# Patient Record
Sex: Male | Born: 1935 | Race: White | Hispanic: No
Health system: Southern US, Community
[De-identification: ages and names within clinical notes are randomized; demographics above are authoritative.]

## PROBLEM LIST (undated history)

## (undated) DIAGNOSIS — I10 Essential (primary) hypertension: Secondary | ICD-10-CM

## (undated) DIAGNOSIS — E559 Vitamin D deficiency, unspecified: Secondary | ICD-10-CM

## (undated) DIAGNOSIS — N400 Enlarged prostate without lower urinary tract symptoms: Secondary | ICD-10-CM

## (undated) DIAGNOSIS — G300 Alzheimer's disease with early onset: Secondary | ICD-10-CM

## (undated) DIAGNOSIS — F339 Major depressive disorder, recurrent, unspecified: Secondary | ICD-10-CM

## (undated) DIAGNOSIS — F0280 Dementia in other diseases classified elsewhere without behavioral disturbance: Secondary | ICD-10-CM

## (undated) DIAGNOSIS — E785 Hyperlipidemia, unspecified: Secondary | ICD-10-CM

## (undated) DIAGNOSIS — E119 Type 2 diabetes mellitus without complications: Secondary | ICD-10-CM

---

## 2018-07-25 ENCOUNTER — Emergency Department
Admission: EM | Admit: 2018-07-25 | Discharge: 2018-07-26 | Disposition: A | Discharge status: Home / Self Care | Payer: Medicare Other | Attending: Emergency Medicine | Admitting: Emergency Medicine

## 2018-07-25 ENCOUNTER — Emergency Department: Payer: Medicare Other

## 2018-07-25 ENCOUNTER — Other Ambulatory Visit: Payer: Self-pay

## 2018-07-25 DIAGNOSIS — W19XXXA Unspecified fall, initial encounter: Secondary | ICD-10-CM | POA: Insufficient documentation

## 2018-07-25 DIAGNOSIS — S51001A Unspecified open wound of right elbow, initial encounter: Secondary | ICD-10-CM | POA: Diagnosis not present

## 2018-07-25 DIAGNOSIS — Z23 Encounter for immunization: Secondary | ICD-10-CM | POA: Insufficient documentation

## 2018-07-25 DIAGNOSIS — I10 Essential (primary) hypertension: Secondary | ICD-10-CM | POA: Diagnosis not present

## 2018-07-25 DIAGNOSIS — Y939 Activity, unspecified: Secondary | ICD-10-CM | POA: Diagnosis not present

## 2018-07-25 DIAGNOSIS — F0280 Dementia in other diseases classified elsewhere without behavioral disturbance: Secondary | ICD-10-CM | POA: Diagnosis not present

## 2018-07-25 DIAGNOSIS — S0083XA Contusion of other part of head, initial encounter: Secondary | ICD-10-CM | POA: Diagnosis not present

## 2018-07-25 DIAGNOSIS — G301 Alzheimer's disease with late onset: Secondary | ICD-10-CM | POA: Insufficient documentation

## 2018-07-25 DIAGNOSIS — S0990XA Unspecified injury of head, initial encounter: Secondary | ICD-10-CM | POA: Diagnosis present

## 2018-07-25 DIAGNOSIS — Y92122 Bedroom in nursing home as the place of occurrence of the external cause: Secondary | ICD-10-CM | POA: Diagnosis not present

## 2018-07-25 DIAGNOSIS — E119 Type 2 diabetes mellitus without complications: Secondary | ICD-10-CM | POA: Insufficient documentation

## 2018-07-25 DIAGNOSIS — S51011A Laceration without foreign body of right elbow, initial encounter: Secondary | ICD-10-CM

## 2018-07-25 DIAGNOSIS — Y999 Unspecified external cause status: Secondary | ICD-10-CM | POA: Insufficient documentation

## 2018-07-25 HISTORY — DX: Vitamin D deficiency, unspecified: E55.9

## 2018-07-25 HISTORY — DX: Type 2 diabetes mellitus without complications: E11.9

## 2018-07-25 HISTORY — DX: Dementia in other diseases classified elsewhere, unspecified severity, without behavioral disturbance, psychotic disturbance, mood disturbance, and anxiety: F02.80

## 2018-07-25 HISTORY — DX: Major depressive disorder, recurrent, unspecified: F33.9

## 2018-07-25 HISTORY — DX: Essential (primary) hypertension: I10

## 2018-07-25 HISTORY — DX: Hyperlipidemia, unspecified: E78.5

## 2018-07-25 HISTORY — DX: Benign prostatic hyperplasia without lower urinary tract symptoms: N40.0

## 2018-07-25 HISTORY — DX: Alzheimer's disease with early onset: G30.0

## 2018-07-25 MED ORDER — TETANUS-DIPHTH-ACELL PERTUSSIS 5-2.5-18.5 LF-MCG/0.5 IM SUSP
0.5000 mL | Freq: Once | INTRAMUSCULAR | Status: AC
  Administered 2018-07-26: 0.5 mL via INTRAMUSCULAR
  Filled 2018-07-25: qty 0.5

## 2018-07-25 NOTE — ED Provider Notes (Signed)
[facility] Emergency Department Provider Note  ____________________________________________   First MD Initiated Contact with Patient 07/25/18 2345     (approximate)  I have reviewed the triage vital signs and the nursing notes.   HISTORY  Chief Complaint Fall  Level 5 exemption history is limited by the patient's profound dementia  HPI Jacob Paul is a 84 y.o. male comes to the emergency department by EMS after an unwitnessed fall at his dementia unit.  He was found lying next to his bed with obvious signs of facial trauma.  The patient himself does not recall what happened and is only able to tell me his name.  He is not sure what year it is nor where he is.  He has no complaints at this time.    Past Medical History:  Diagnosis Date  . Alzheimer's disease with early onset (HCC)   . BPH without obstruction/lower urinary tract symptoms   . Diabetes mellitus type 2, controlled, without complications (HCC)   . Essential hypertension   . Hyperlipidemia, unspecified   . Major depressive disorder, recurrent, unspecified (HCC)   . Vitamin D deficiency     There are no active problems to display for this patient.     Prior to Admission medications   Not on File    Allergies Patient has no known allergies.  No family history on file.  Social History Social History   Tobacco Use  . Smoking status: Not on file  Substance Use Topics  . Alcohol use: Not on file  . Drug use: Not on file    Review of Systems Level 5 exemption history is limited by the patient's dementia ____________________________________________   PHYSICAL EXAM:  VITAL SIGNS: ED Triage Vitals  Enc Vitals Group     BP      Pulse      Resp      Temp      Temp src      SpO2      Weight      Height      Head Circumference      Peak Flow      Pain Score      Pain Loc      Pain Edu?      Excl. in GC?     Constitutional: Alert and oriented x1 to name only.   Appears calm and cooperative Eyes: PERRL EOMI. midrange and brisk Head: Abrasion and ecchymosis to right side of his forehead. Nose: No congestion/rhinnorhea. Mouth/Throat: No trismus Neck: No stridor.  No midline tenderness or step-offs Cardiovascular: Normal rate, regular rhythm. Grossly normal heart sounds.  Good peripheral circulation. Respiratory: Normal respiratory effort.  No retractions. Lungs CTAB and moving good air Gastrointestinal: Soft nontender Musculoskeletal: No lower extremity edema   Neurologic:   No gross focal neurologic deficits are appreciated. Skin: 3 cm skin tear to right lateral elbow Psychiatric: Calm and cooperative but with profound dementia    ____________________________________________   DIFFERENTIAL includes but not limited to  Syncope, mechanical fall, metabolic arrangement, intracerebral hemorrhage, cervical spine fracture ____________________________________________   LABS (all labs ordered are listed, but only abnormal results are displayed)  Labs Reviewed  URINALYSIS, COMPLETE (UACMP) WITH MICROSCOPIC - Abnormal; Notable for the following components:      Result Value   Color, Urine YELLOW (*)    APPearance CLEAR (*)    Leukocytes, UA TRACE (*)    Bacteria, UA RARE (*)    All other components  within normal limits  COMPREHENSIVE METABOLIC PANEL - Abnormal; Notable for the following components:   Glucose, Bld 109 (*)    All other components within normal limits  CBC WITH DIFFERENTIAL/PLATELET - Abnormal; Notable for the following components:   RBC 3.95 (*)    Hemoglobin 12.8 (*)    HCT 37.9 (*)    All other components within normal limits  TROPONIN I    Lab work reviewed by me with no clear etiology of the patient's symptoms identified __________________________________________  EKG  ED ECG REPORT I, Merrily Brittle, the attending physician, personally viewed and interpreted this ECG.  Date: 07/25/2018 EKG Time:  Rate:  71 Rhythm: normal sinus rhythm QRS Axis: Rightward axis Intervals: First-degree AV block ST/T Wave abnormalities: Bifascicular block with appropriate repolarization Narrative Interpretation: no evidence of acute ischemia  ____________________________________________  RADIOLOGY  CT scan of the head and neck reviewed by me with no acute disease Chest x-ray and elbow x-ray reviewed by me with no acute disease ____________________________________________   PROCEDURES  Procedure(s) performed: no  Procedures  Critical Care performed: no  ____________________________________________   INITIAL IMPRESSION / ASSESSMENT AND PLAN / ED COURSE  Pertinent labs & imaging results that were available during my care of the patient were reviewed by me and considered in my medical decision making (see chart for details).   As part of my medical decision making, I reviewed the following data within the electronic MEDICAL RECORD NUMBER History obtained from family if available, nursing notes, old chart and ekg, as well as notes from prior ED visits.  Patient comes to the emergency department with obvious facial trauma after either a mechanical fall versus syncope.  CT head and neck were obtained given the trauma and fortunately are negative.  Lab work is reassuring and EKG with no concerning signs for ventricular tachycardia.  He was kept on monitor multiple hours with no ectopy.  At this point he is medically stable for outpatient management.  Tetanus is been updated.      ____________________________________________   FINAL CLINICAL IMPRESSION(S) / ED DIAGNOSES  Final diagnoses:  Fall, initial encounter  Contusion of face, initial encounter  Skin tear of right elbow without complication, initial encounter      NEW MEDICATIONS STARTED DURING THIS VISIT:  There are no discharge medications for this patient.    Note:  This document was prepared using Dragon voice recognition software and  may include unintentional dictation errors.     Merrily Brittle, MD 07/27/18 [phone]

## 2018-07-25 NOTE — ED Triage Notes (Signed)
Patient presents to Emergency Department via EMS with complaints of unwitnessed fall.  Pt from [facility], and was found by staff lying next to bed.  Pt is from dementia unit and hx of Alzheimer's, no evidence of blood thinners.  Pt oriented to self and birthday only.

## 2018-07-26 ENCOUNTER — Encounter: Payer: Self-pay | Admitting: Emergency Medicine

## 2018-07-26 DIAGNOSIS — S0083XA Contusion of other part of head, initial encounter: Secondary | ICD-10-CM | POA: Diagnosis not present

## 2018-07-26 LAB — COMPREHENSIVE METABOLIC PANEL
ALT: 19 U/L (ref 0–44)
ANION GAP: 8 (ref 5–15)
AST: 29 U/L (ref 15–41)
Albumin: 4.6 g/dL (ref 3.5–5.0)
Alkaline Phosphatase: 60 U/L (ref 38–126)
BUN: 23 mg/dL (ref 8–23)
CHLORIDE: 108 mmol/L (ref 98–111)
CO2: 23 mmol/L (ref 22–32)
Calcium: 9.3 mg/dL (ref 8.9–10.3)
Creatinine, Ser: 0.97 mg/dL (ref 0.61–1.24)
Glucose, Bld: 109 mg/dL — ABNORMAL HIGH (ref 70–99)
POTASSIUM: 3.6 mmol/L (ref 3.5–5.1)
SODIUM: 139 mmol/L (ref 135–145)
Total Bilirubin: 0.6 mg/dL (ref 0.3–1.2)
Total Protein: 6.9 g/dL (ref 6.5–8.1)

## 2018-07-26 LAB — URINALYSIS, COMPLETE (UACMP) WITH MICROSCOPIC
Bilirubin Urine: NEGATIVE
Glucose, UA: NEGATIVE mg/dL
Hgb urine dipstick: NEGATIVE
Ketones, ur: NEGATIVE mg/dL
Nitrite: NEGATIVE
PROTEIN: NEGATIVE mg/dL
SPECIFIC GRAVITY, URINE: 1.014 (ref 1.005–1.030)
pH: 6 (ref 5.0–8.0)

## 2018-07-26 LAB — CBC WITH DIFFERENTIAL/PLATELET
Abs Immature Granulocytes: 0.01 10*3/uL (ref 0.00–0.07)
BASOS PCT: 0 %
Basophils Absolute: 0 10*3/uL (ref 0.0–0.1)
Eosinophils Absolute: 0.1 10*3/uL (ref 0.0–0.5)
Eosinophils Relative: 1 %
HCT: 37.9 % — ABNORMAL LOW (ref 39.0–52.0)
Hemoglobin: 12.8 g/dL — ABNORMAL LOW (ref 13.0–17.0)
IMMATURE GRANULOCYTES: 0 %
Lymphocytes Relative: 22 %
Lymphs Abs: 1.2 10*3/uL (ref 0.7–4.0)
MCH: 32.4 pg (ref 26.0–34.0)
MCHC: 33.8 g/dL (ref 30.0–36.0)
MCV: 95.9 fL (ref 80.0–100.0)
Monocytes Absolute: 0.5 10*3/uL (ref 0.1–1.0)
Monocytes Relative: 9 %
NEUTROS PCT: 68 %
Neutro Abs: 3.8 10*3/uL (ref 1.7–7.7)
PLATELETS: 223 10*3/uL (ref 150–400)
RBC: 3.95 MIL/uL — AB (ref 4.22–5.81)
RDW: 12.1 % (ref 11.5–15.5)
WBC: 5.6 10*3/uL (ref 4.0–10.5)
nRBC: 0 % (ref 0.0–0.2)

## 2018-07-26 LAB — TROPONIN I

## 2018-07-26 NOTE — ED Notes (Signed)
Multiple attempts, at 3 different numbers with no answer after ringer timed out, to call report unsucessful.  Will transport back to [facility] via EMS

## 2018-07-26 NOTE — ED Notes (Signed)
Report given to EMS

## 2018-07-26 NOTE — Discharge Instructions (Signed)
Fortunately today your lab work and your x-rays and CT scans were reassuring.  Please follow-up with your primary care physician as needed return to the emergency department for any concerns.  It was a pleasure to take care of you today, and thank you for coming to our emergency department.  If you have any questions or concerns before leaving please ask the nurse to grab me and I'm more than happy to go through your aftercare instructions again.  If you were prescribed any opioid pain medication today such as Norco, Vicodin, Percocet, morphine, hydrocodone, or oxycodone please make sure you do not drive when you are taking this medication as it can alter your ability to drive safely.  If you have any concerns once you are home that you are not improving or are in fact getting worse before you can make it to your follow-up appointment, please do not hesitate to call 911 and come back for further evaluation.  Merrily Brittle, MD  Results for orders placed or performed during the hospital encounter of 07/25/18  Comprehensive metabolic panel  Result Value Ref Range   Sodium 139 135 - 145 mmol/L   Potassium 3.6 3.5 - 5.1 mmol/L   Chloride 108 98 - 111 mmol/L   CO2 23 22 - 32 mmol/L   Glucose, Bld 109 (H) 70 - 99 mg/dL   BUN 23 8 - 23 mg/dL   Creatinine, Ser 4.54 0.61 - 1.24 mg/dL   Calcium 9.3 8.9 - 09.8 mg/dL   Total Protein 6.9 6.5 - 8.1 g/dL   Albumin 4.6 3.5 - 5.0 g/dL   AST 29 15 - 41 U/L   ALT 19 0 - 44 U/L   Alkaline Phosphatase 60 38 - 126 U/L   Total Bilirubin 0.6 0.3 - 1.2 mg/dL   GFR calc non Af Amer >60 >60 mL/min   GFR calc Af Amer >60 >60 mL/min   Anion gap 8 5 - 15  Troponin I  Result Value Ref Range   Troponin I <0.03 <0.03 ng/mL  CBC with Differential  Result Value Ref Range   WBC 5.6 4.0 - 10.5 K/uL   RBC 3.95 (L) 4.22 - 5.81 MIL/uL   Hemoglobin 12.8 (L) 13.0 - 17.0 g/dL   HCT 11.9 (L) 14.7 - 82.9 %   MCV 95.9 80.0 - 100.0 fL   MCH 32.4 26.0 - 34.0 pg   MCHC 33.8  30.0 - 36.0 g/dL   RDW 56.2 13.0 - 86.5 %   Platelets 223 150 - 400 K/uL   nRBC 0.0 0.0 - 0.2 %   Neutrophils Relative % 68 %   Neutro Abs 3.8 1.7 - 7.7 K/uL   Lymphocytes Relative 22 %   Lymphs Abs 1.2 0.7 - 4.0 K/uL   Monocytes Relative 9 %   Monocytes Absolute 0.5 0.1 - 1.0 K/uL   Eosinophils Relative 1 %   Eosinophils Absolute 0.1 0.0 - 0.5 K/uL   Basophils Relative 0 %   Basophils Absolute 0.0 0.0 - 0.1 K/uL   Immature Granulocytes 0 %   Abs Immature Granulocytes 0.01 0.00 - 0.07 K/uL   Dg Elbow Complete Right  Result Date: 07/26/2018 CLINICAL DATA:  Unwitnessed fall. Skin tear to the elbow. EXAM: RIGHT ELBOW - COMPLETE 3+ VIEW COMPARISON:  None. FINDINGS: No evidence of acute fracture or dislocation of the elbow. No significant effusion. Prominent olecranon spur. Old appearing ossicle over the coronoid process. Soft tissues are unremarkable. No radiopaque soft tissue foreign bodies. IMPRESSION:  No acute bony abnormalities. Electronically Signed   By: Burman Nieves M.D.   On: 07/26/2018 00:36   Ct Head Wo Contrast  Result Date: 07/26/2018 CLINICAL DATA:  84 year old male with history of unwitnessed fall. Dementia. EXAM: CT HEAD WITHOUT CONTRAST CT CERVICAL SPINE WITHOUT CONTRAST TECHNIQUE: Multidetector CT imaging of the head and cervical spine was performed following the standard protocol without intravenous contrast. Multiplanar CT image reconstructions of the cervical spine were also generated. COMPARISON:  None. FINDINGS: CT HEAD FINDINGS Brain: Moderate cerebral and mild cerebellar atrophy. Patchy and confluent areas of decreased attenuation are noted throughout the deep and periventricular white matter of the cerebral hemispheres bilaterally, compatible with severe chronic microvascular ischemic disease. No evidence of acute infarction, hemorrhage, hydrocephalus, extra-axial collection or mass lesion/mass effect. Vascular: No hyperdense vessel or unexpected calcification.  Skull: Normal. Negative for fracture or focal lesion. Sinuses/Orbits: No acute finding. Other: None. CT CERVICAL SPINE FINDINGS Alignment: Normal. Skull base and vertebrae: No acute fracture. No primary bone lesion or focal pathologic process. Soft tissues and spinal canal: No prevertebral fluid or swelling. No visible canal hematoma. Disc levels: C4-C5, multilevel degenerative disc disease, most severe C5-C6 at and C6-C7. Moderate to severe multilevel facet arthropathy. Upper chest: Unremarkable. Other: None. IMPRESSION: 1. No evidence of significant acute traumatic injury to the skull, brain or cervical spine. 2. Moderate cerebral and mild cerebellar atrophy with severe chronic microvascular ischemic changes in the cerebral white matter. 3. Multilevel degenerative disc disease and cervical spondylosis, as above. Electronically Signed   By: Trudie Reed M.D.   On: 07/26/2018 00:44   Ct Cervical Spine Wo Contrast  Result Date: 07/26/2018 CLINICAL DATA:  84 year old male with history of unwitnessed fall. Dementia. EXAM: CT HEAD WITHOUT CONTRAST CT CERVICAL SPINE WITHOUT CONTRAST TECHNIQUE: Multidetector CT imaging of the head and cervical spine was performed following the standard protocol without intravenous contrast. Multiplanar CT image reconstructions of the cervical spine were also generated. COMPARISON:  None. FINDINGS: CT HEAD FINDINGS Brain: Moderate cerebral and mild cerebellar atrophy. Patchy and confluent areas of decreased attenuation are noted throughout the deep and periventricular white matter of the cerebral hemispheres bilaterally, compatible with severe chronic microvascular ischemic disease. No evidence of acute infarction, hemorrhage, hydrocephalus, extra-axial collection or mass lesion/mass effect. Vascular: No hyperdense vessel or unexpected calcification. Skull: Normal. Negative for fracture or focal lesion. Sinuses/Orbits: No acute finding. Other: None. CT CERVICAL SPINE FINDINGS  Alignment: Normal. Skull base and vertebrae: No acute fracture. No primary bone lesion or focal pathologic process. Soft tissues and spinal canal: No prevertebral fluid or swelling. No visible canal hematoma. Disc levels: C4-C5, multilevel degenerative disc disease, most severe C5-C6 at and C6-C7. Moderate to severe multilevel facet arthropathy. Upper chest: Unremarkable. Other: None. IMPRESSION: 1. No evidence of significant acute traumatic injury to the skull, brain or cervical spine. 2. Moderate cerebral and mild cerebellar atrophy with severe chronic microvascular ischemic changes in the cerebral white matter. 3. Multilevel degenerative disc disease and cervical spondylosis, as above. Electronically Signed   By: Trudie Reed M.D.   On: 07/26/2018 00:44   Dg Chest Port 1 View  Result Date: 07/26/2018 CLINICAL DATA:  Unwitnessed fall. Dementia. History of Alzheimer's. Syncope. Right elbow laceration. EXAM: PORTABLE CHEST 1 VIEW COMPARISON:  None. FINDINGS: Hyperinflation suggesting mild emphysema. No airspace disease or consolidation in the lungs. No blunting of costophrenic angles. No pneumothorax. Mediastinal contours appear intact. Normal heart size and pulmonary vascularity. Degenerative changes in the spine and shoulders. IMPRESSION: No  active disease. Electronically Signed   By: Burman Nieves M.D.   On: 07/26/2018 00:35

## 2018-07-26 NOTE — ED Notes (Signed)
DG with pt att, tear to skin at medial aspect of right elbow, hematoma and abraded skin to right upper forehead

## 2018-07-26 NOTE — ED Notes (Signed)
Patient returned from radiology department via stretcher.

## 2018-07-26 NOTE — ED Notes (Signed)
Patient transported to CT 

## 2018-07-26 NOTE — ED Notes (Signed)
Pt urine collected in urinal, sent for UA, diaper changed and pt cleaned of stool, diffuse redness noted to sacral area to bottom of gluteal cleft

## 2018-07-26 NOTE — ED Notes (Signed)
Peripheral IV discontinued. Catheter intact. No signs of infiltration or redness. Gauze applied to IV site.   Discharge instructions reviewed with EMS. Questions fielded by this RN. EMS verbalizes understanding of instructions. Patient discharged to [facility] in stable condition per rifenbark. No acute distress noted at time of discharge.   No answer at facility for report call

## 2018-08-20 ENCOUNTER — Other Ambulatory Visit: Payer: Self-pay

## 2018-08-20 ENCOUNTER — Emergency Department
Admission: EM | Admit: 2018-08-20 | Discharge: 2018-08-21 | Disposition: A | Discharge status: Home / Self Care | Payer: Medicare Other | Attending: Emergency Medicine | Admitting: Emergency Medicine

## 2018-08-20 ENCOUNTER — Emergency Department: Payer: Medicare Other

## 2018-08-20 ENCOUNTER — Encounter: Payer: Self-pay | Admitting: Emergency Medicine

## 2018-08-20 DIAGNOSIS — W19XXXA Unspecified fall, initial encounter: Secondary | ICD-10-CM | POA: Diagnosis not present

## 2018-08-20 DIAGNOSIS — Z7982 Long term (current) use of aspirin: Secondary | ICD-10-CM | POA: Insufficient documentation

## 2018-08-20 DIAGNOSIS — Y939 Activity, unspecified: Secondary | ICD-10-CM | POA: Diagnosis not present

## 2018-08-20 DIAGNOSIS — Y92128 Other place in nursing home as the place of occurrence of the external cause: Secondary | ICD-10-CM | POA: Insufficient documentation

## 2018-08-20 DIAGNOSIS — G300 Alzheimer's disease with early onset: Secondary | ICD-10-CM | POA: Diagnosis not present

## 2018-08-20 DIAGNOSIS — I629 Nontraumatic intracranial hemorrhage, unspecified: Secondary | ICD-10-CM | POA: Diagnosis not present

## 2018-08-20 DIAGNOSIS — Z79899 Other long term (current) drug therapy: Secondary | ICD-10-CM | POA: Insufficient documentation

## 2018-08-20 DIAGNOSIS — E119 Type 2 diabetes mellitus without complications: Secondary | ICD-10-CM | POA: Insufficient documentation

## 2018-08-20 DIAGNOSIS — I10 Essential (primary) hypertension: Secondary | ICD-10-CM | POA: Insufficient documentation

## 2018-08-20 DIAGNOSIS — S8991XA Unspecified injury of right lower leg, initial encounter: Secondary | ICD-10-CM | POA: Diagnosis present

## 2018-08-20 DIAGNOSIS — Y999 Unspecified external cause status: Secondary | ICD-10-CM | POA: Insufficient documentation

## 2018-08-20 DIAGNOSIS — S80211A Abrasion, right knee, initial encounter: Secondary | ICD-10-CM | POA: Diagnosis not present

## 2018-08-20 NOTE — Discharge Instructions (Addendum)
Jacob Paul head CT showed a likely subacute subdural hemorrhage and a small subarachnoid hemorrhage.  Neurosurgery was consulted and does not recommend any acute interventions at this time.  We recommend that his ASPIRIN should be DISCONTINUED and we recommend a repeat CT head in 2 weeks.  He should be return to the emergency department if he develops any altered mental status or neurologic symptoms.

## 2018-08-20 NOTE — Consult Note (Signed)
I reviewed the case with Dr. Marisa Severin.  84 yo male with PMH dementia living in nursing facility presents with falls.  At neurologic baseline.  HCT shows subacute to chronic SDH on the L with MLS of approximately 4 mm.     If he remains clinically stable and has stable repeat head CT, would recommend xfer back to nursing facility with follow-up in 2 weeks.  No AED bc of potential side effects.  Mr. Jacob Paul is a poor operative candidate due to dementia.  Venetia Night MD

## 2018-08-20 NOTE — ED Notes (Signed)
MD at the bedside to evaluate pt.

## 2018-08-20 NOTE — ED Triage Notes (Signed)
Pt presents to ED via EMS from [facility] after an unwitnessed fall. Pt was found on the floor near the bathroom. Pt has hx of dementia and multiple falls. Abrasion to back of head and to right knee. Pt alert and answering questions. Oriented to self and birthday. States he doesn't pretend to know where he is.

## 2018-08-20 NOTE — ED Provider Notes (Signed)
[facility] Emergency Department Provider Note ____________________________________________   First MD Initiated Contact with Patient 08/20/18 2216     (approximate)  I have reviewed the triage vital signs and the nursing notes.   HISTORY  Chief Complaint Fall  Level 5 caveat: History of present illness limited due to dementia  HPI Jacob Paul is a 84 y.o. male with PMH as noted below who presents with a head injury after an apparent fall.  Per EMS, the patient was found at his facility on the floor near the bathroom.  The patient reports that the bathroom floors slippery and he has had frequent falls of that he does not specifically remember the fall today.  He denies headache or other pain at this time.  He has no other symptoms at this time.  Past Medical History:  Diagnosis Date  . Alzheimer's disease with early onset (HCC)   . BPH without obstruction/lower urinary tract symptoms   . Diabetes mellitus type 2, controlled, without complications (HCC)   . Essential hypertension   . Hyperlipidemia, unspecified   . Major depressive disorder, recurrent, unspecified (HCC)   . Vitamin D deficiency     There are no active problems to display for this patient.   History reviewed. No pertinent surgical history.  Prior to Admission medications   Medication Sig Start Date End Date Taking? Authorizing Provider  alendronate (FOSAMAX) 70 MG tablet Take 1 tablet by mouth once a week. On empty stomach, Drink 8 Oz of water with this medication. 07/07/18  Yes [provider]  aspirin 81 MG chewable tablet Chew 81 mg by mouth daily.   Yes [provider]  cholecalciferol (VITAMIN D3) 25 MCG (1000 UT) tablet Take 1,000 Units by mouth 2 (two) times daily.   Yes [provider]  Cod Liver Oil CAPS Take 1 capsule by mouth daily.   Yes [provider]  donepezil (ARICEPT) 5 MG tablet Take 1 tablet by mouth at bedtime. 08/08/18  Yes  [provider]  doxazosin (CARDURA) 4 MG tablet Take 1 tablet by mouth at bedtime. 08/08/18  Yes [provider]  finasteride (PROSCAR) 5 MG tablet Take 1 tablet by mouth daily. 08/08/18  Yes [provider]  Garlic 1000 MG CAPS Take 1 capsule by mouth daily.   Yes [provider]  glimepiride (AMARYL) 1 MG tablet Take 1 tablet by mouth daily. 08/08/18  Yes [provider]  metFORMIN (GLUCOPHAGE-XR) 750 MG 24 hr tablet Take 1 tablet by mouth daily with breakfast. 08/19/18  Yes [provider]  QUEtiapine (SEROQUEL) 25 MG tablet Take 12.5 mg by mouth 2 (two) times daily. 08/08/18  Yes [provider]  saccharomyces boulardii (FLORASTOR) 250 MG capsule Take 250 mg by mouth daily.   Yes [provider]  traZODone (DESYREL) 50 MG tablet Take 1 tablet by mouth at bedtime. 08/08/18  Yes [provider]  vitamin B-12 (CYANOCOBALAMIN) 1000 MCG tablet Take 1,000 mcg by mouth daily.   Yes [provider]    Allergies Patient has no known allergies.  No family history on file.  Social History Social History   Tobacco Use  . Smoking status: Unknown If Ever Smoked  Substance Use Topics  . Alcohol use: Not Currently    Frequency: Never  . Drug use: Not Currently    Review of Systems Level 5 caveat: Unable to obtain review of systems due to dementia    ____________________________________________   PHYSICAL EXAM:  VITAL SIGNS: ED Triage Vitals  Enc Vitals Group     BP 08/20/18 2204 (!) 141/55     Pulse Rate 08/20/18 2204 75     Resp 08/20/18 2204 18     Temp 08/20/18 2204 98.3 F (36.8 C)     Temp Source 08/20/18 2204 Oral     SpO2 08/20/18 2204 98 %     Weight 08/20/18 2206 174 lb 2.6 oz (79 kg)     Height 08/20/18 2206 5\' 10"  (1.778 m)     Head Circumference --      Peak Flow --      Pain Score 08/20/18 2205 2     Pain Loc --      Pain Edu? --      Excl. in GC? --     Constitutional:  Alert, slightly confused.  Relatively well appearing for age and in no acute distress. Eyes: Conjunctivae are normal.  EOMI.  PERRLA. Head: 2 cm occipital abrasion. Nose: No congestion/rhinnorhea. Mouth/Throat: Mucous membranes are moist.   Neck: Normal range of motion.  No midline cervical spinal tenderness. Cardiovascular: Normal rate, regular rhythm. Grossly normal heart sounds.  Good peripheral circulation. Respiratory: Normal respiratory effort.  No retractions. Lungs CTAB. Gastrointestinal: Soft and nontender. No distention.  Genitourinary: No flank tenderness. Musculoskeletal: No lower extremity edema.  Extremities warm and well perfused.  Pain on range of motion of right knee with 2 cm superficial abrasion just inferior to the knee.  No deformity.  2+ DP pulses bilaterally.  No midline spinal tenderness. Neurologic:  Normal speech and language.  Motor and sensory intact in all extremities.  Normal coordination.  No gross focal neurologic deficits are appreciated.  Skin:  Skin is warm and dry. No rash noted. Psychiatric: Mood and affect are normal. Speech and behavior are normal.  ____________________________________________   LABS (all labs ordered are listed, but only abnormal results are displayed)  Labs Reviewed - No data to display ____________________________________________  EKG  ED ECG REPORT I, Dionne Bucy, the attending physician, personally viewed and interpreted this ECG.  Date: 08/20/2018 EKG Time: 2205 Rate: 74 Rhythm: normal sinus rhythm QRS Axis: normal Intervals: RBBB ST/T Wave abnormalities: normal Narrative Interpretation: no evidence of acute ischemia  ____________________________________________  RADIOLOGY  CT head: Subacute to chronic appearing subdural hematoma and small foci of possible acute subarachnoid on the left CT cervical spine: No acute fracture XR R knee: No acute  fracture  ____________________________________________   PROCEDURES  Procedure(s) performed: No  Procedures  Critical Care performed: No ____________________________________________   INITIAL IMPRESSION / ASSESSMENT AND PLAN / ED COURSE  Pertinent labs & imaging results that were available during my care of the patient were reviewed by me and considered in my medical decision making (see chart for details).  84 year old male with PMH as noted above and a history of dementia presents after an apparent fall.  The patient was found on the floor near the bathroom.  He himself reported to me that the floor was slippery and he has frequent falls.  He denies any pain or other symptoms currently.  On exam he is relatively well-appearing for his age and his vital signs are normal.  He appears well-hydrated.  Exam reveals occipital scalp abrasion as well as abrasions to the right knee.  Given his age I will obtain a CT of the head, C-spine, and x-ray of the right knee.  His EKG shows no acute findings.  Based on the exam and lack  of other symptoms, the presentation is most consistent with a mechanical fall and there is no indication for lab work-up at this time.  ----------------------------------------- 11:59 PM on 08/20/2018 -----------------------------------------  CT head shows subacute to chronic appearing left subdural with small foci of possible more acute subarachnoid.  I went back and repeated the neurologic exam and it is still nonfocal.  The patient has no facial droop, no motor drift, normal coordination, and intact strength and sensation to all extremities.  I consulted Dr. Marcell Barlow from neurosurgery.  He advises that given the CT findings and exam, there is no indication for acute intervention.  He recommends that the patient could either be admitted for observation or have repeat CT after 4 hours and discharged back to the facility if there is no acute change.  If the  patient is discharged back to facility, the recognition will be repeat CT in 2 weeks and discontinuation of his aspirin.  I am signing the patient out to the oncoming physician Dr. Dolores Frame. ____________________________________________   FINAL CLINICAL IMPRESSION(S) / ED DIAGNOSES  Final diagnoses:  Abrasion of right knee, initial encounter  Intracranial hemorrhage (HCC)      NEW MEDICATIONS STARTED DURING THIS VISIT:  New Prescriptions   No medications on file     Note:  This document was prepared using Dragon voice recognition software and may include unintentional dictation errors.    Dionne Bucy, MD 08/21/18 0000

## 2018-08-21 ENCOUNTER — Emergency Department: Payer: Medicare Other

## 2018-08-21 DIAGNOSIS — S80211A Abrasion, right knee, initial encounter: Secondary | ICD-10-CM | POA: Diagnosis not present

## 2018-08-21 LAB — URINALYSIS, COMPLETE (UACMP) WITH MICROSCOPIC
BILIRUBIN URINE: NEGATIVE
Glucose, UA: NEGATIVE mg/dL
Hgb urine dipstick: NEGATIVE
Ketones, ur: NEGATIVE mg/dL
Nitrite: NEGATIVE
Protein, ur: NEGATIVE mg/dL
SPECIFIC GRAVITY, URINE: 1.01 (ref 1.005–1.030)
pH: 6 (ref 5.0–8.0)

## 2018-08-21 NOTE — ED Provider Notes (Signed)
-----------------------------------------   00:31 AM on 08/21/2018 -----------------------------------------  Assumed care of patient who is pending repeat head CT in 4 hours.  No focal neurological deficits on examination.  ----------------------------------------- 2:31 AM on 08/21/2018 -----------------------------------------  Patient had to urinate.  Will send for urinalysis.  No focal neurological deficits on reexamination.  ----------------------------------------- 3:13 AM on 08/21/2018 -----------------------------------------  Repeat CT head interpreted per Dr. Andria Meuse: No acute intracranial abnormalities. Chronic left frontotemporal  subdural hematoma with mild left-to-right midline shift of 7 mm. No  change in appearance since previous study. Chronic atrophy and small  vessel ischemic changes.   Updated patient of repeat CT scan.  Desires to go home.  Tired of waiting.  Does not want to wait for urinalysis.  Remains without focal neurological deficits.  Will discharge home per previous provider with repeat head scan in 2 weeks.   Irean Hong, MD 08/21/18 [phone]

## 2018-08-21 NOTE — ED Notes (Signed)
Assisted pt with use of urinal. Tolerated well. Required a 2 person assisted to ensure safety. Pt alert and talkative at this time.

## 2018-08-21 NOTE — ED Notes (Signed)
EMS arrived to transport pt back to [facility]

## 2018-08-21 NOTE — ED Notes (Signed)
Assisted pt to bedside commode; voided moderate amount. Pt alert and talkative. Asking for something to eat. Applesauce provided. Provided for pt safety and comfort and will continue to assess.

## 2018-09-07 ENCOUNTER — Emergency Department: Payer: Medicare Other

## 2018-09-07 ENCOUNTER — Emergency Department
Admission: EM | Admit: 2018-09-07 | Discharge: 2018-09-07 | Disposition: A | Discharge status: Home / Self Care | Payer: Medicare Other | Attending: Emergency Medicine | Admitting: Emergency Medicine

## 2018-09-07 ENCOUNTER — Other Ambulatory Visit: Payer: Self-pay

## 2018-09-07 ENCOUNTER — Encounter: Payer: Self-pay | Admitting: Emergency Medicine

## 2018-09-07 DIAGNOSIS — I10 Essential (primary) hypertension: Secondary | ICD-10-CM | POA: Insufficient documentation

## 2018-09-07 DIAGNOSIS — I6201 Nontraumatic acute subdural hemorrhage: Secondary | ICD-10-CM | POA: Insufficient documentation

## 2018-09-07 DIAGNOSIS — E119 Type 2 diabetes mellitus without complications: Secondary | ICD-10-CM | POA: Insufficient documentation

## 2018-09-07 DIAGNOSIS — R319 Hematuria, unspecified: Secondary | ICD-10-CM | POA: Insufficient documentation

## 2018-09-07 DIAGNOSIS — R296 Repeated falls: Secondary | ICD-10-CM | POA: Insufficient documentation

## 2018-09-07 DIAGNOSIS — F0390 Unspecified dementia without behavioral disturbance: Secondary | ICD-10-CM | POA: Diagnosis not present

## 2018-09-07 DIAGNOSIS — Y998 Other external cause status: Secondary | ICD-10-CM | POA: Diagnosis not present

## 2018-09-07 DIAGNOSIS — Z7984 Long term (current) use of oral hypoglycemic drugs: Secondary | ICD-10-CM | POA: Insufficient documentation

## 2018-09-07 DIAGNOSIS — I6203 Nontraumatic chronic subdural hemorrhage: Secondary | ICD-10-CM | POA: Diagnosis not present

## 2018-09-07 DIAGNOSIS — Z043 Encounter for examination and observation following other accident: Secondary | ICD-10-CM | POA: Diagnosis present

## 2018-09-07 DIAGNOSIS — Y92129 Unspecified place in nursing home as the place of occurrence of the external cause: Secondary | ICD-10-CM | POA: Diagnosis not present

## 2018-09-07 DIAGNOSIS — W19XXXA Unspecified fall, initial encounter: Secondary | ICD-10-CM | POA: Diagnosis not present

## 2018-09-07 DIAGNOSIS — Z79899 Other long term (current) drug therapy: Secondary | ICD-10-CM | POA: Diagnosis not present

## 2018-09-07 DIAGNOSIS — Z66 Do not resuscitate: Secondary | ICD-10-CM | POA: Insufficient documentation

## 2018-09-07 DIAGNOSIS — M25561 Pain in right knee: Secondary | ICD-10-CM | POA: Diagnosis not present

## 2018-09-07 DIAGNOSIS — N390 Urinary tract infection, site not specified: Secondary | ICD-10-CM

## 2018-09-07 DIAGNOSIS — Y939 Activity, unspecified: Secondary | ICD-10-CM | POA: Insufficient documentation

## 2018-09-07 LAB — CBC WITH DIFFERENTIAL/PLATELET
ABS IMMATURE GRANULOCYTES: 0.04 10*3/uL (ref 0.00–0.07)
BASOS PCT: 1 %
Basophils Absolute: 0 10*3/uL (ref 0.0–0.1)
Eosinophils Absolute: 0.1 10*3/uL (ref 0.0–0.5)
Eosinophils Relative: 2 %
HCT: 37.4 % — ABNORMAL LOW (ref 39.0–52.0)
Hemoglobin: 12.2 g/dL — ABNORMAL LOW (ref 13.0–17.0)
Immature Granulocytes: 1 %
Lymphocytes Relative: 23 %
Lymphs Abs: 1.3 10*3/uL (ref 0.7–4.0)
MCH: 31.9 pg (ref 26.0–34.0)
MCHC: 32.6 g/dL (ref 30.0–36.0)
MCV: 97.7 fL (ref 80.0–100.0)
MONO ABS: 0.5 10*3/uL (ref 0.1–1.0)
Monocytes Relative: 8 %
NEUTROS ABS: 3.9 10*3/uL (ref 1.7–7.7)
Neutrophils Relative %: 65 %
Platelets: 250 10*3/uL (ref 150–400)
RBC: 3.83 MIL/uL — AB (ref 4.22–5.81)
RDW: 12.8 % (ref 11.5–15.5)
WBC: 5.8 10*3/uL (ref 4.0–10.5)
nRBC: 0 % (ref 0.0–0.2)

## 2018-09-07 LAB — URINALYSIS, ROUTINE W REFLEX MICROSCOPIC
BILIRUBIN URINE: NEGATIVE
Glucose, UA: NEGATIVE mg/dL
KETONES UR: NEGATIVE mg/dL
NITRITE: NEGATIVE
PH: 6 (ref 5.0–8.0)
Protein, ur: NEGATIVE mg/dL
Specific Gravity, Urine: 1.011 (ref 1.005–1.030)
Squamous Epithelial / LPF: NONE SEEN (ref 0–5)

## 2018-09-07 LAB — BASIC METABOLIC PANEL
Anion gap: 9 (ref 5–15)
BUN: 24 mg/dL — AB (ref 8–23)
CALCIUM: 9.2 mg/dL (ref 8.9–10.3)
CO2: 24 mmol/L (ref 22–32)
CREATININE: 0.85 mg/dL (ref 0.61–1.24)
Chloride: 108 mmol/L (ref 98–111)
GFR calc non Af Amer: >60 mL/min (ref >60)
GLUCOSE: 106 mg/dL — AB (ref 70–99)
Potassium: 3.8 mmol/L (ref 3.5–5.1)
Sodium: 141 mmol/L (ref 135–145)

## 2018-09-07 LAB — TROPONIN I

## 2018-09-07 MED ORDER — HALOPERIDOL LACTATE 5 MG/ML IJ SOLN
2.5000 mg | Freq: Once | INTRAMUSCULAR | Status: AC
  Administered 2018-09-07: 2.5 mg via INTRAVENOUS
  Filled 2018-09-07: qty 1

## 2018-09-07 MED ORDER — CEPHALEXIN 500 MG PO CAPS: 500.0000 mg | ORAL_CAPSULE | Freq: Three times a day (TID) | ORAL | 0 refills | Status: AC

## 2018-09-07 MED ORDER — CEPHALEXIN 500 MG PO CAPS
500.0000 mg | ORAL_CAPSULE | Freq: Once | ORAL | Status: AC
  Administered 2018-09-07: 500 mg via ORAL
  Filled 2018-09-07: qty 1

## 2018-09-07 NOTE — ED Notes (Signed)
Attempted to call report back to facility, no answer from facility.

## 2018-09-07 NOTE — ED Notes (Signed)
Pt continues to be restless, yelling out, requiring frequent redirection.

## 2018-09-07 NOTE — ED Notes (Signed)
[facility] called to given report, no answer

## 2018-09-07 NOTE — ED Provider Notes (Signed)
[facility] Emergency Department Provider Note  ____________________________________________   First MD Initiated Contact with Patient 09/07/18 0010     (approximate)  I have reviewed the triage vital signs and the nursing notes.   HISTORY  Chief Complaint Fall  Level 5 caveat:  history/ROS limited by chronic dementia  HPI Jacob Paul is a 84 y.o. male with advanced dementia who lives at [facility] and presents by EMS for evaluation after an unwitnessed fall.  Reportedly this is his third fall today.  He does not appear to have injured anything and is not in distress and denies pain, but his daughter reportedly wanted him checked out given the repeated falls.  He denies chest pain, shortness of breath, abdominal pain.  He reports some pain in his right knee but only when he moves it.  He reports a little bit of pain in his left elbow but again only when he moves it.  He denies headache and neck pain.  He is in no distress but cannot offer any additional history.  Past Medical History:  Diagnosis Date  . Alzheimer's disease with early onset (HCC)   . BPH without obstruction/lower urinary tract symptoms   . Diabetes mellitus type 2, controlled, without complications (HCC)   . Essential hypertension   . Hyperlipidemia, unspecified   . Major depressive disorder, recurrent, unspecified (HCC)   . Vitamin D deficiency     There are no active problems to display for this patient.   History reviewed. No pertinent surgical history.  Prior to Admission medications   Medication Sig Start Date End Date Taking? Authorizing Provider  alendronate (FOSAMAX) 70 MG tablet Take 1 tablet by mouth once a week. On empty stomach, Drink 8 Oz of water with this medication. 07/07/18   [provider]  aspirin 81 MG chewable tablet Chew 81 mg by mouth daily.    [provider]  cephALEXin (KEFLEX) 500 MG capsule Take 1 capsule (500 mg total) by mouth 3  (three) times daily. 09/07/18   Loleta Rose, MD  cholecalciferol (VITAMIN D3) 25 MCG (1000 UT) tablet Take 1,000 Units by mouth 2 (two) times daily.    [provider]  [facility] Liver Oil CAPS Take 1 capsule by mouth daily.    [provider]  donepezil (ARICEPT) 5 MG tablet Take 1 tablet by mouth at bedtime. 08/08/18   [provider]  doxazosin (CARDURA) 4 MG tablet Take 1 tablet by mouth at bedtime. 08/08/18   [provider]  finasteride (PROSCAR) 5 MG tablet Take 1 tablet by mouth daily. 08/08/18   [provider]  Garlic 1000 MG CAPS Take 1 capsule by mouth daily.    [provider]  glimepiride (AMARYL) 1 MG tablet Take 1 tablet by mouth daily. 08/08/18   [provider]  metFORMIN (GLUCOPHAGE-XR) 750 MG 24 hr tablet Take 1 tablet by mouth daily with breakfast. 08/19/18   [provider]  QUEtiapine (SEROQUEL) 25 MG tablet Take 12.5 mg by mouth 2 (two) times daily. 08/08/18   [provider]  saccharomyces boulardii (FLORASTOR) 250 MG capsule Take 250 mg by mouth daily.    [provider]  traZODone (DESYREL) 50 MG tablet Take 1 tablet by mouth at bedtime. 08/08/18   [provider]  vitamin B-12 (CYANOCOBALAMIN) 1000 MCG tablet Take 1,000 mcg by mouth daily.    [provider]    Allergies Patient has no known allergies.  History reviewed. No pertinent  family history.  Social History Social History   Tobacco Use  . Smoking status: Unknown If Ever Smoked  Substance Use Topics  . Alcohol use: Not Currently    Frequency: Never  . Drug use: Not Currently    Review of Systems Level 5 caveat:  history/ROS limited by chronic dementia.  See above for details. ____________________________________________   PHYSICAL EXAM:  VITAL SIGNS: ED Triage Vitals  Enc Vitals Group     BP 09/07/18 0012 133/64     Pulse Rate 09/07/18 0012 79     Resp 09/07/18 0012 20     Temp 09/07/18  0012 98.3 F (36.8 C)     Temp Source 09/07/18 0012 Oral     SpO2 09/07/18 0012 100 %     Weight 09/07/18 0013 79 kg (174 lb 2.6 oz)     Height 09/07/18 0013 1.778 m (5\' 10" )     Head Circumference --      Peak Flow --      Pain Score --      Pain Loc --      Pain Edu? --      Excl. in GC? --     Constitutional: Alert and oriented to self.   Eyes: Conjunctivae are normal.  Head: Atraumatic. Nose: No congestion/rhinnorhea. Mouth/Throat: Mucous membranes are moist. Neck: No stridor.  No meningeal signs.  No tenderness to palpation of the cervical spine and no pain with flexion, extension, and rotation of the head and neck. Cardiovascular: Normal rate, regular rhythm. Good peripheral circulation. Grossly normal heart sounds. Respiratory: Normal respiratory effort.  No retractions. Lungs CTAB. Gastrointestinal: Soft and nontender. No distention.  Musculoskeletal: No lower extremity tenderness nor edema. No gross deformities of extremities.  I fully passively ranged his shoulders, elbows, wrists, hips, and knees.  There was no reproducible pain in any of the joints.  He has some tenderness to palpation and a bruise behind his right knee that is subacute (and noted on his last ED visit).  No skin tears are visualized and no gross deformities suggestive of fractures. Neurologic:  Normal speech and language. No gross focal neurologic deficits are appreciated.  Skin:  Skin is warm, dry and intact. No rash noted. Psychiatric: Mood and affect are normal. Speech and behavior are normal.  ____________________________________________   LABS (all labs ordered are listed, but only abnormal results are displayed)  Labs Reviewed  URINALYSIS, ROUTINE W REFLEX MICROSCOPIC - Abnormal; Notable for the following components:      Result Value   Color, Urine YELLOW (*)    APPearance CLEAR (*)    Hgb urine dipstick MODERATE (*)    Leukocytes, UA MODERATE (*)    Bacteria, UA RARE (*)    All other  components within normal limits  BASIC METABOLIC PANEL - Abnormal; Notable for the following components:   Glucose, Bld 106 (*)    BUN 24 (*)    All other components within normal limits  CBC WITH DIFFERENTIAL/PLATELET - Abnormal; Notable for the following components:   RBC 3.83 (*)    Hemoglobin 12.2 (*)    HCT 37.4 (*)    All other components within normal limits  URINE CULTURE  TROPONIN I   ____________________________________________  EKG  ED ECG REPORT I, Loleta Rose, the attending physician, personally viewed and interpreted this ECG.  Date: 09/07/2018 EKG Time: 00:17 Rate: 73 Rhythm: normal sinus rhythm QRS Axis: normal Intervals: RBBB ST/T Wave abnormalities: Non-specific ST segment / T-wave changes, but  no evidence of acute ischemia. Narrative Interpretation: no evidence of acute ischemia   ____________________________________________  RADIOLOGY I, Loleta Roseory Giavanna Kang, personally discussed these images and results by phone with the on-call radiologist and used this discussion as part of my medical decision making.    ED MD interpretation:  Acute on chronic frontal subdural, increased in size from prior.  Official radiology report(s): Ct Head Wo Contrast  Result Date: 09/07/2018 CLINICAL DATA:  Intracranial hemorrhage follow EXAM: CT HEAD WITHOUT CONTRAST TECHNIQUE: Contiguous axial images were obtained from the base of the skull through the vertex without intravenous contrast. COMPARISON:  09/07/2018 at 2:08 a.m. FINDINGS: Brain: Unchanged appearance of acute on chronic left convexity subdural hematoma. Rightward midline shift of 5 mm is unchanged. No new site of hemorrhage. There is mild generalized atrophy with findings of chronic microvascular ischemia. Vascular: Atherosclerotic calcification of the vertebral and internal carotid arteries at the skull base. No abnormal hyperdensity of the major intracranial arteries or dural venous sinuses. Skull: The visualized skull  base, calvarium and extracranial soft tissues are normal. Sinuses/Orbits: No fluid levels or advanced mucosal thickening of the visualized paranasal sinuses. No mastoid or middle ear effusion. The orbits are normal. IMPRESSION: Unchanged appearance of acute on chronic left convexity subdural hematoma with 5 mm of rightward midline shift. Electronically Signed   By: Deatra RobinsonKevin  Herman M.D.   On: 09/07/2018 06:23   Ct Head Wo Contrast  Result Date: 09/07/2018 CLINICAL DATA:  Multiple falls EXAM: CT HEAD WITHOUT CONTRAST TECHNIQUE: Contiguous axial images were obtained from the base of the skull through the vertex without intravenous contrast. COMPARISON:  Head CT 08/21/2018 FINDINGS: Brain: There is a new area of acute hemorrhage along the left frontal convexity, superimposed on a chronic left convexity subdural hematoma. The acute component locally measures approximately 36 x 6 mm. Measured on coronal images at the level of the basilar artery, the extra-axial collection measures 14 mm in thickness, previously 10 mm. Rightward midline shift of 5 mm has slightly increased from 2 mm. Vascular: Atherosclerotic calcification of the vertebral and internal carotid arteries at the skull base. No abnormal hyperdensity of the major intracranial arteries or dural venous sinuses. Skull: The visualized skull base, calvarium and extracranial soft tissues are normal. Sinuses/Orbits: No fluid levels or advanced mucosal thickening of the visualized paranasal sinuses. No mastoid or middle ear effusion. The orbits are normal. IMPRESSION: 1. Acute on chronic left convexity subdural hematoma, overall increased in size and measuring up to 14 mm in thickness. 2. Mildly increased rightward midline shift, now measuring 5 mm. Critical Value/emergent results were called by telephone at the time of interpretation on 09/07/2018 at 2:28 am to Dr. Loleta RoseORY Alverta Caccamo , who verbally acknowledged these results. Electronically Signed   By: Deatra RobinsonKevin  Herman  M.D.   On: 09/07/2018 02:29    ____________________________________________   PROCEDURES  Critical Care performed: No.  Does not qualify for critical care time because I did not initiate an acute intervention.   Procedure(s) performed:   Procedures   ____________________________________________   INITIAL IMPRESSION / ASSESSMENT AND PLAN / ED COURSE  As part of my medical decision making, I reviewed the following data within the electronic MEDICAL RECORD NUMBER History obtained from family, Nursing notes reviewed and incorporated, Labs reviewed , EKG interpreted , Old chart reviewed, Discussed with radiologist and Notes from prior ED visits    Differential diagnosis includes, but is not limited to, mechanical fall, acute on chronic subdural hematoma/hemorrhage, acute infection such as urinary tract  infection, fracture/dislocation.  The patient is well-appearing in spite of his chronic dementia.  I am reassured by his physical exam given that there is no sign of any acute injury and he does not appear to have suffered from any injuries to his torso, abdomen, nor extremities.  I also do not see any evidence of acute injury to his head although it is possible he is having some rebleeding from his prior injury; a review of his medical record indicates that about 2 weeks ago he was here for an acute on chronic subdural and was observed in the emergency department for 4 hours for a repeat head CT after phone consultation with Dr.Yarbrough.  I reviewed the note written by Dr. Myer Haff who noted that the patient would be a poor operative candidate even if the bleeding does get worse given his age and comorbidities.  No indication for radiographs.  He has some hematuria of unclear origin which I will treat empirically given that last time he was here he also had some white blood cells but without red cells.  No indication for abdominal imaging.  Troponin is negative, basic metabolic panel is normal, CBC  is normal.  Urine culture is in process.  I called and spoke by phone with his daughter and healthcare power of attorney, Ms. Lajean Saver.  She was comfortable with the plan for discharge back to his facility.  After she called to my attention the fact that the patient had a subdural previously, I reviewed his record as described above, and we agreed that I would not repeat head CT today to make sure there is no evidence of worsening bleeding.  If there is, I will touch base with her to come up with a disposition plan.  Clinical Course as of Sep 08 643  [facility] Sep 07, 2018  4098 The patient has an acute bleed at the site of the subacute or chronic subdural.  I spoke with radiology about it and he was certain that this does represent an acute bleed.Patient remained stable.  I called and spoke again with his daughter who is his healthcare power of attorney.  We discussed multiple possibilities including transfer to a major Medical Center, admission here for neurosurgical evaluation in the morning, or period of observation and repeat CT scan as occurred the last time the patient was here.  She would prefer to do the latter.  Additionally we discussed end-of-life planning and she authorized me to fill out a DNR order.  I think that is appropriate given the patient's chronic medical conditions and I have completed the goldenrod form and added to his record.  I will repeat head CT 4 hours after the original procedure the bleeding is worse.  We will not pursue aggressive care should he decompensate acutely.   [CF]  0405 Patient is increasingly agitated and trying to climb out of bed but is not decompensating neurologically, he is just agitated because he is out of his environment.  I am providing haloperidol 2.5 mg IV as a calming agent.   [CF]  [phone] Patient still agitated, I am ordering another haloperidol 2.5 mg IV.   [CF]  4782 Unchanged appearance of the acute on chronic subdural hematoma with 5 mm of  midline shift.  The patient remains neurologically intact and is currently calm.  No appreciable neurological deficits.  I will continue as per the prior plan discussed with his daughter/healthcare power of attorney and discharge him back to his facility with information  in the discharge instructions regarding his findings and disposition.  CT Head Wo Contrast [CF]    Clinical Course User Index [CF] Loleta Rose, MD    ____________________________________________  FINAL CLINICAL IMPRESSION(S) / ED DIAGNOSES  Final diagnoses:  Fall, initial encounter  Urinary tract infection with hematuria, site unspecified  Acute on chronic intracranial subdural hematoma (HCC)  DNR (do not resuscitate)     MEDICATIONS GIVEN DURING THIS VISIT:  Medications  cephALEXin (KEFLEX) capsule 500 mg (500 mg Oral Given 09/07/18 0145)  haloperidol lactate (HALDOL) injection 2.5 mg (2.5 mg Intravenous Given 09/07/18 0416)  haloperidol lactate (HALDOL) injection 2.5 mg (2.5 mg Intravenous Given 09/07/18 0446)     ED Discharge Orders         Ordered    cephALEXin (KEFLEX) 500 MG capsule  3 times daily     09/07/18 0144           Note:  This document was prepared using Dragon voice recognition software and may include unintentional dictation errors.    Loleta Rose, MD 09/07/18 [phone]

## 2018-09-07 NOTE — ED Notes (Signed)
Pt continues to be restless. Asking to get up to the toilet. With the assistance of 3 staff pt assisted up to the toilet. Pt barely able to bear weight. Pt allowed to sit on the toilet with stand by assistance of 2 staff. Pt did urinate. Pt assisted back to bed.

## 2018-09-07 NOTE — ED Notes (Signed)
Pt given breakfast tray

## 2018-09-07 NOTE — ED Triage Notes (Signed)
[facility] report [facility]itnessed [facility]. Also report of 3 falls today. Pt denies injury but has hx of dementia.

## 2018-09-07 NOTE — ED Notes (Signed)
[facility] Hay[facility] updated on plan of care for patient.

## 2018-09-07 NOTE — Discharge Instructions (Addendum)
Mr. Jacob Paul appears to have some new bleeding in the subdural hematoma that he had after his last fall.  However, we observed him overnight in the emergency department and repeated his head CT, and it is not getting any worse.  Just as with his visit 2 weeks ago, he is being discharged to follow-up with his regular doctor.  I also provided information for him and his family to follow-up with neurosurgery if they want.  However she is, I spoke with his daughter/healthcare power of attorney, and she confirmed that she would not want to pursue neurosurgery even if his condition worsens.  Additionally, at her request, I filled out DNR paperwork.  Please keep this with him at all times and make sure it is transported with him to the Emergency Department in the future.  Mr. Jacob Paul may also have a urinary tract infection and we have provided a prescription for Keflex.  Please make sure he takes the full prescription and follows up with his doctor at the next available opportunity.

## 2018-09-07 NOTE — ED Notes (Signed)
Pt restless. Attempting to get out of the bed. Assisted with the urinal and voided approximately 100 ml of urine. Continued to attempt to reorient patient. Dr York CeriseForbach informed of increased agitation. Will give orders.

## 2018-09-07 NOTE — ED Notes (Signed)
Pt's facility called to give report, no answer

## 2018-09-10 LAB — URINE CULTURE: Special Requests: NORMAL

## 2019-03-15 DEATH — deceased

## 2019-12-29 IMAGING — CT CT HEAD W/O CM
3 series · 15 of 47 positions shown, 18 images · non-contrast
Comparison: Head CT 08/21/2018

CLINICAL DATA: Multiple falls

EXAM:
CT HEAD WITHOUT CONTRAST
TECHNIQUE: Contiguous axial images were obtained from the base of the skull
through the vertex without intravenous contrast.

[Series 2: head wo · axial · 0.47mm/px · z∈[-146,-6]mm · 9 of 34 slices shown, 12 images]
[im 3/34  brain]
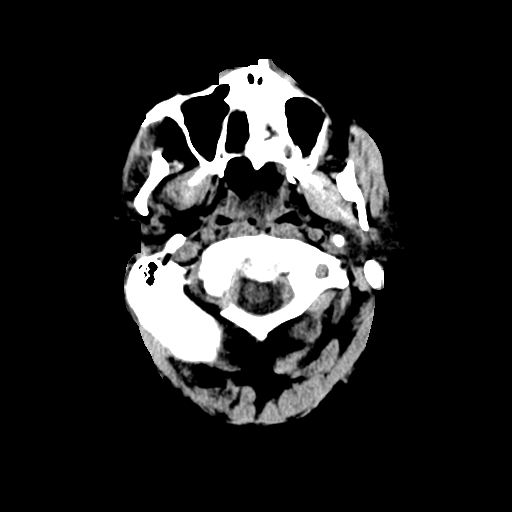
[im 3/34  bone]
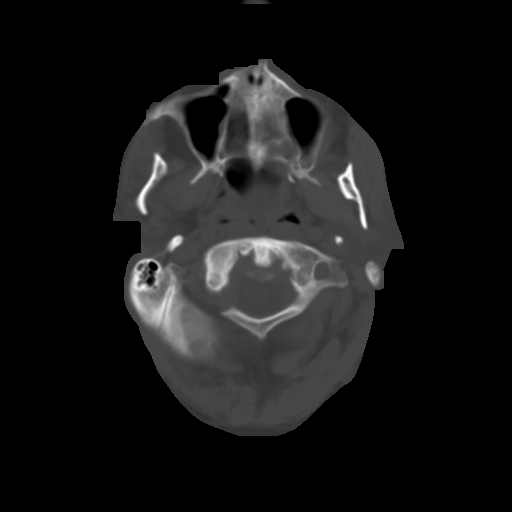
[im 6/34  brain]
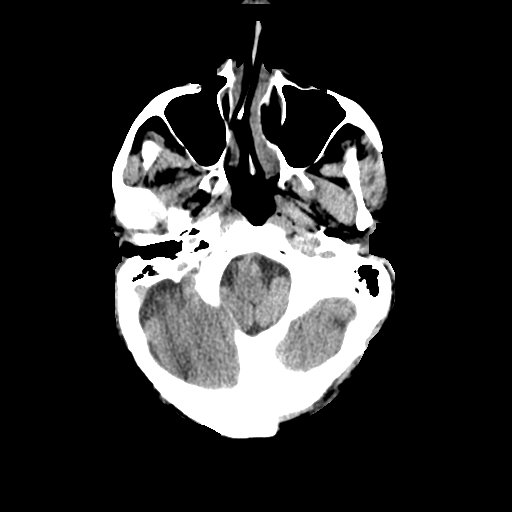
[im 10/34  brain]
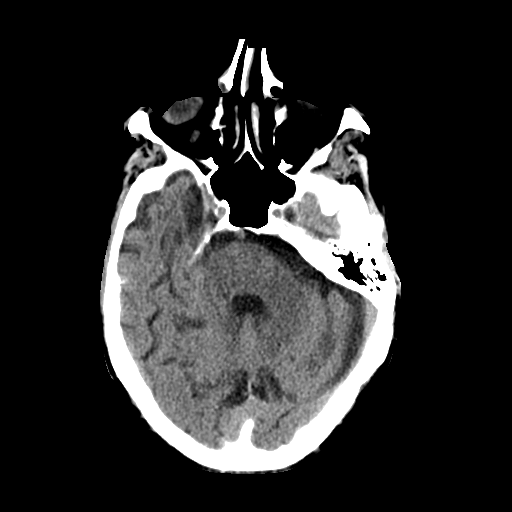
[im 13/34  brain]
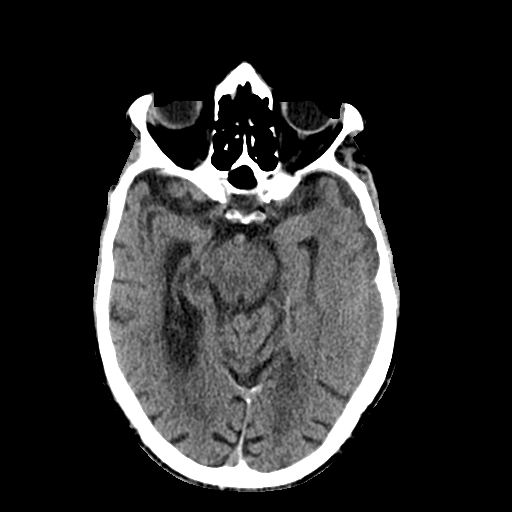
[im 18/34  brain]
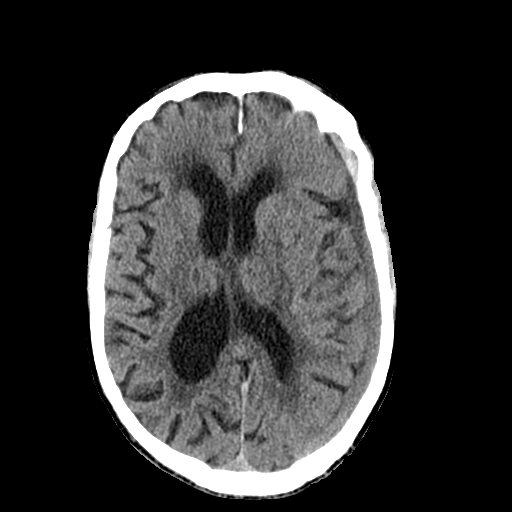
[im 18/34  bone]
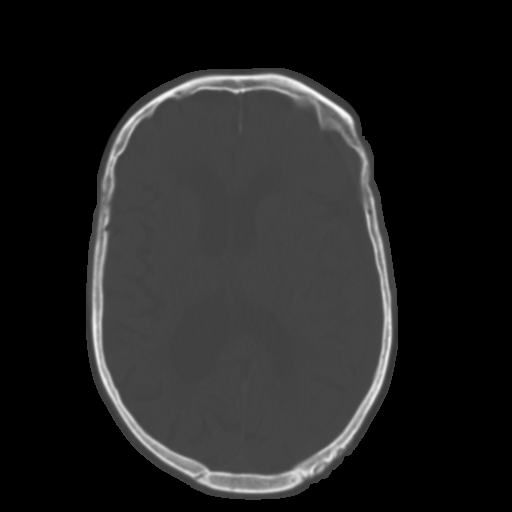
[im 21/34  brain]
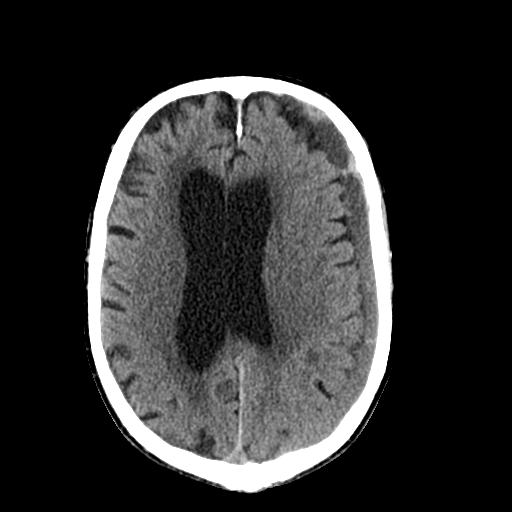
[im 24/34  brain]
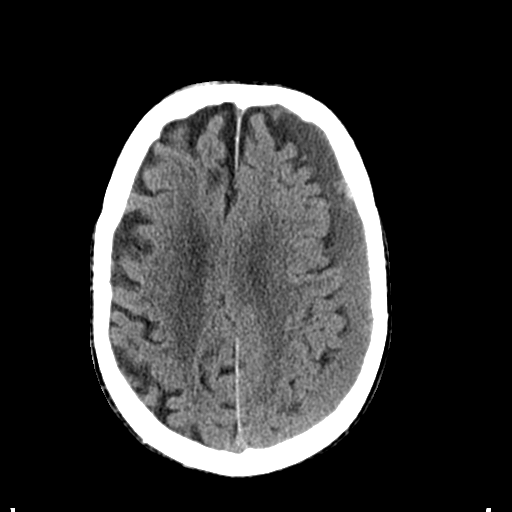
[im 28/34  brain]
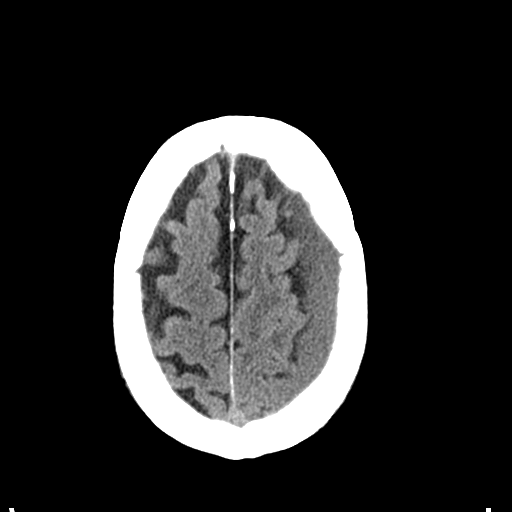
[im 31/34  brain]
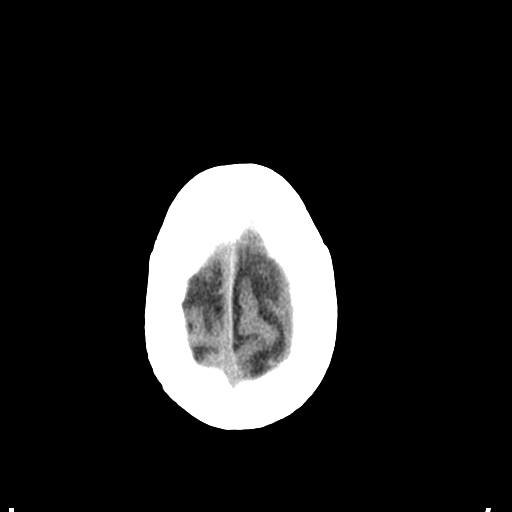
[im 31/34  bone]
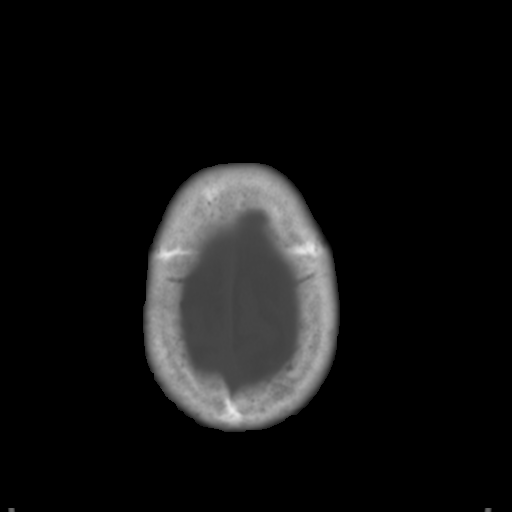

[Series 4: coronal soft tissue · coronal · 0.33mm/px · 3 of 76 slices shown]
[im 26/76  brain]
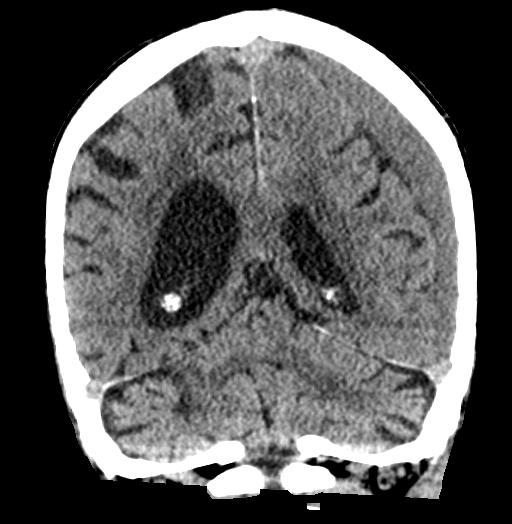
[im 34/76  brain]
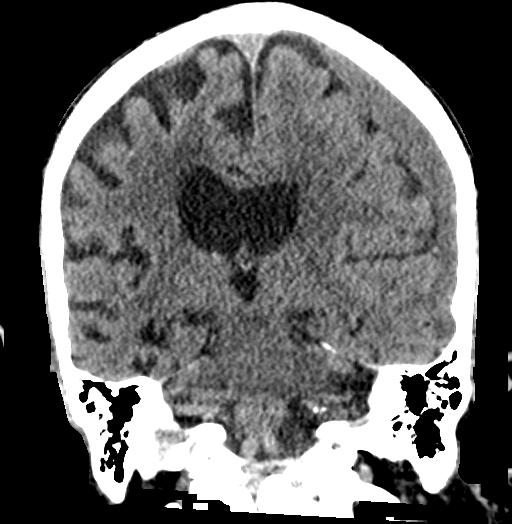
[im 42/76  brain]
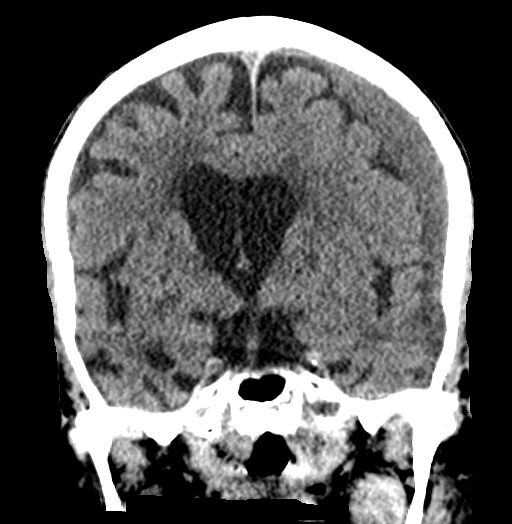

[Series 5: sagittal soft tissue · sagittal · 0.36mm/px · 3 of 55 slices shown]
[im 19/55  brain]
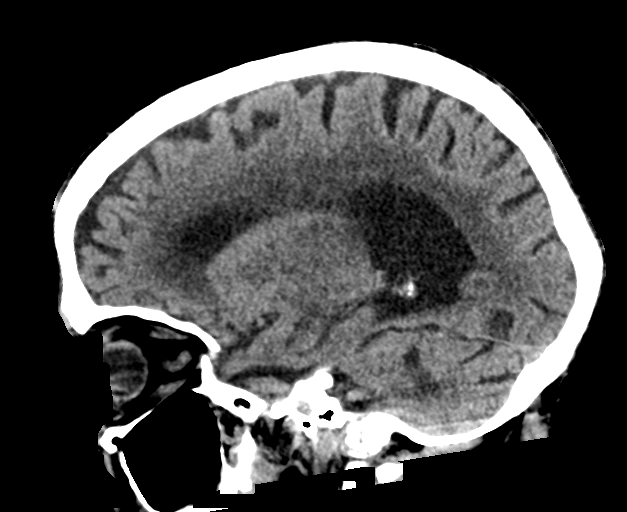
[im 28/55  brain]
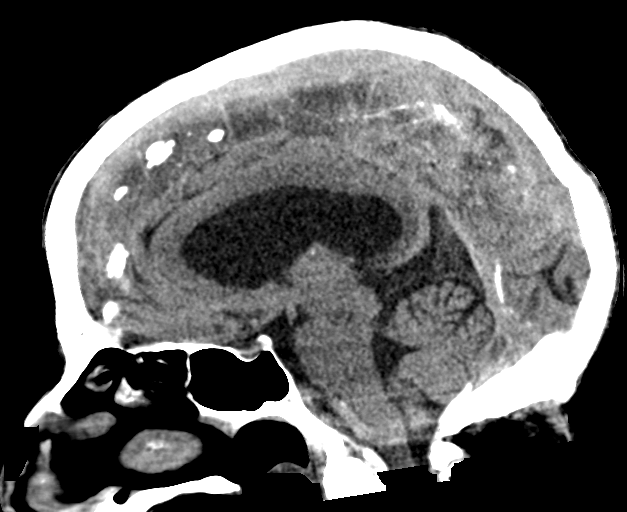
[im 37/55  brain]
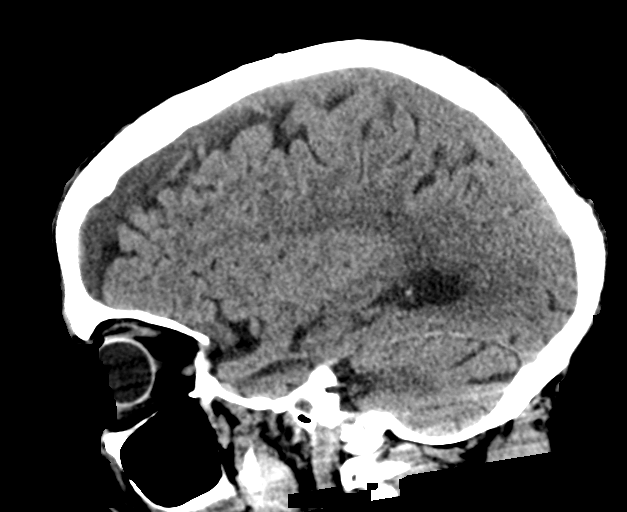

[15 of 47 positions shown; findings below may reference images not displayed]

FINDINGS: Brain: There is a new area of acute hemorrhage along the left
frontal convexity, superimposed on a chronic left convexity subdural
hematoma. The acute component locally measures approximately 36 x 6
mm. Measured on coronal images at the level of the basilar artery,
the extra-axial collection measures 14 mm in thickness, previously
10 mm. Rightward midline shift of 5 mm has slightly increased from 2
mm.

Vascular: Atherosclerotic calcification of the vertebral and
internal carotid arteries at the skull base. No abnormal
hyperdensity of the major intracranial arteries or dural venous
sinuses.

Skull: The visualized skull base, calvarium and extracranial soft
tissues are normal.

Sinuses/Orbits: No fluid levels or advanced mucosal thickening of
the visualized paranasal sinuses. No mastoid or middle ear effusion.
The orbits are normal.
IMPRESSION: 1. Acute on chronic left convexity subdural hematoma, overall
increased in size and measuring up to 14 mm in thickness.
2. Mildly increased rightward midline shift, now measuring 5 mm.

Critical Value/emergent results were called by telephone at the time
of interpretation on 09/07/2018 at [DATE] to Dr. ARZHANG VOMACKA , who
verbally acknowledged these results.

## 2019-12-29 IMAGING — CT CT HEAD W/O CM
3 of 4 series · 15 of 47 positions shown, 18 images · non-contrast
Comparison: 09/07/2018 at [DATE] a.m..

CLINICAL DATA: Intracranial hemorrhage follow

EXAM:
CT HEAD WITHOUT CONTRAST
TECHNIQUE: Contiguous axial images were obtained from the base of the skull
through the vertex without intravenous contrast.

[Series 2: head wo · axial · 0.44mm/px · z∈[-100,+35]mm · 9 of 33 slices shown, 12 images]
[im 3/33  brain]
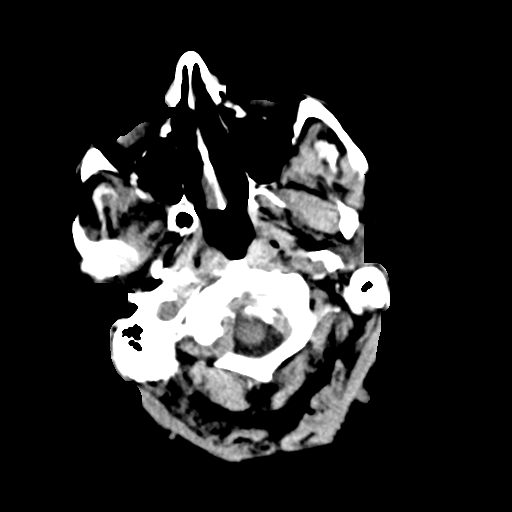
[im 3/33  bone]
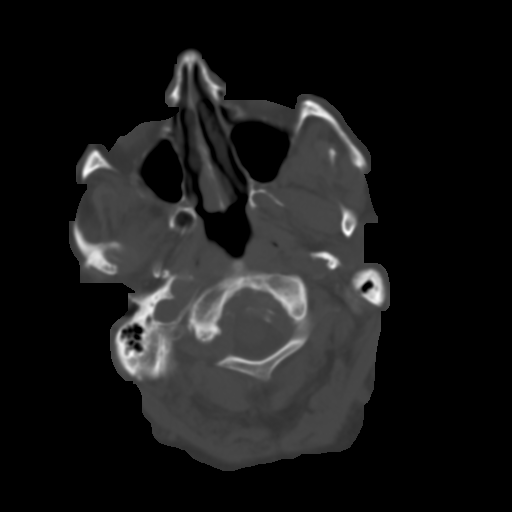
[im 7/33  brain]
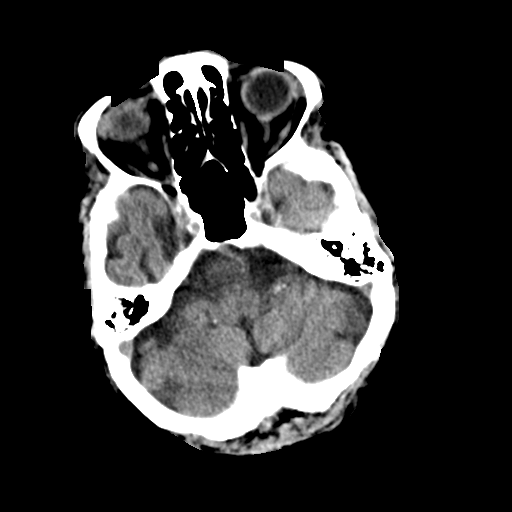
[im 10/33  brain]
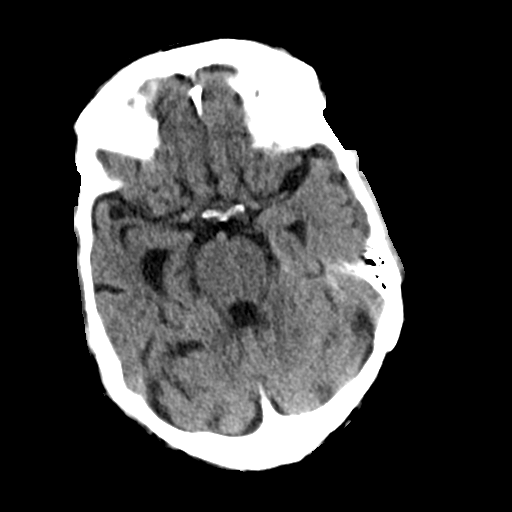
[im 14/33  brain]
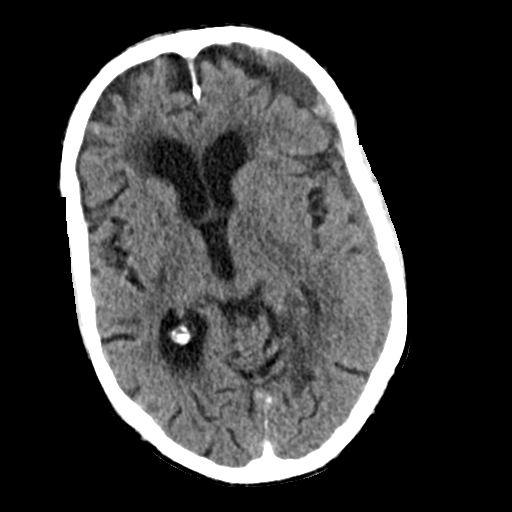
[im 17/33  brain]
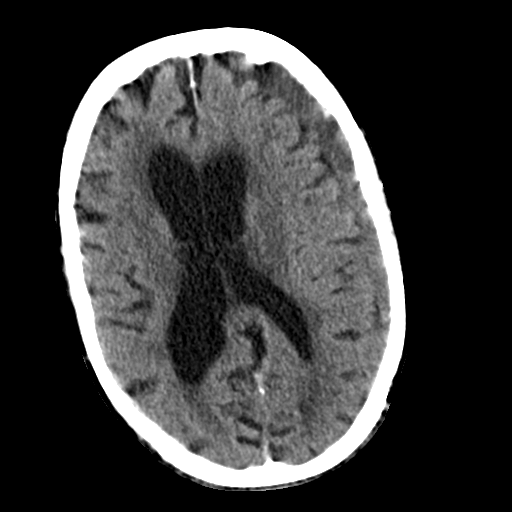
[im 17/33  bone]
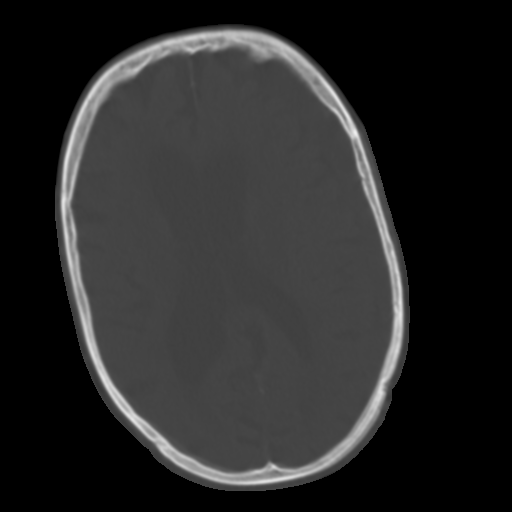
[im 19/33  brain]
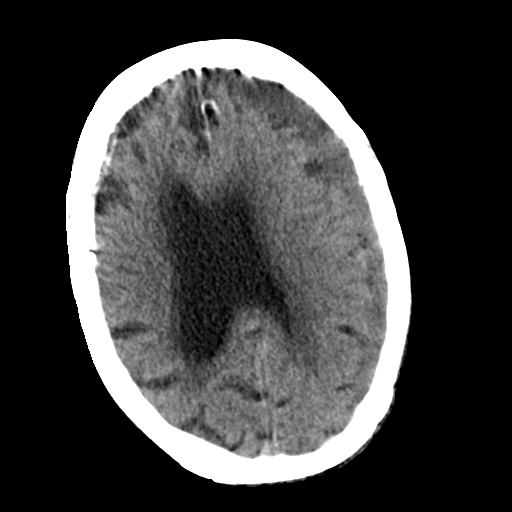
[im 23/33  brain]
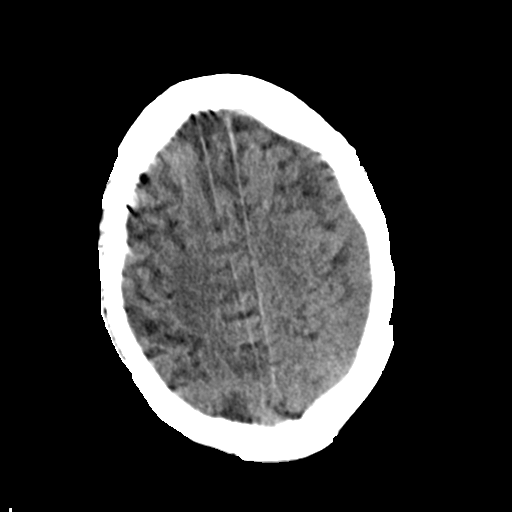
[im 26/33  brain]
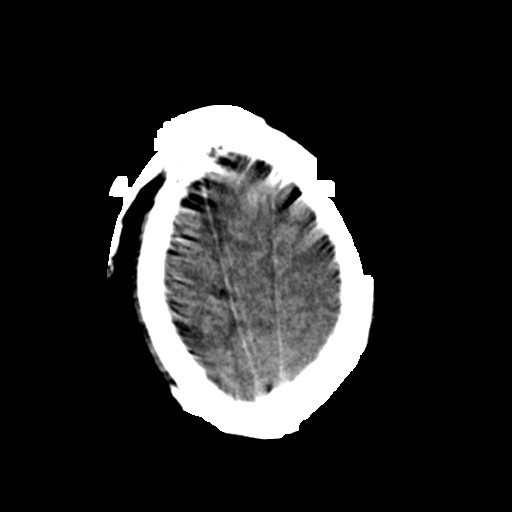
[im 30/33  brain]
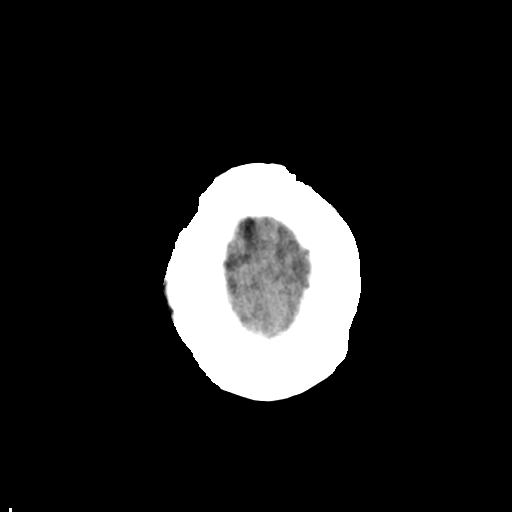
[im 30/33  bone]
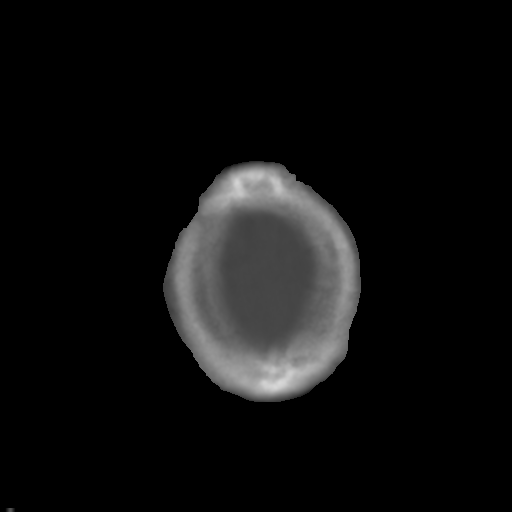

[Series 6: coronal soft tissue · coronal · 0.31mm/px · 3 of 69 slices shown]
[im 23/69  brain]
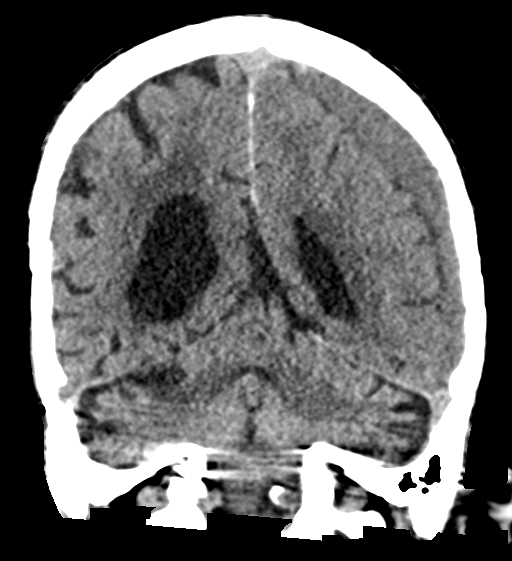
[im 31/69  brain]
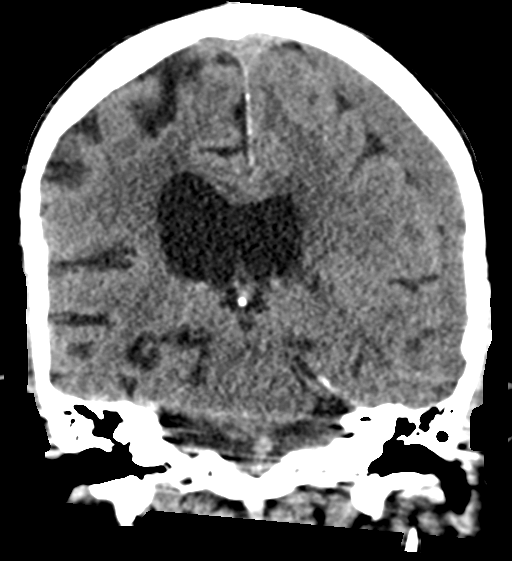
[im 38/69  brain]
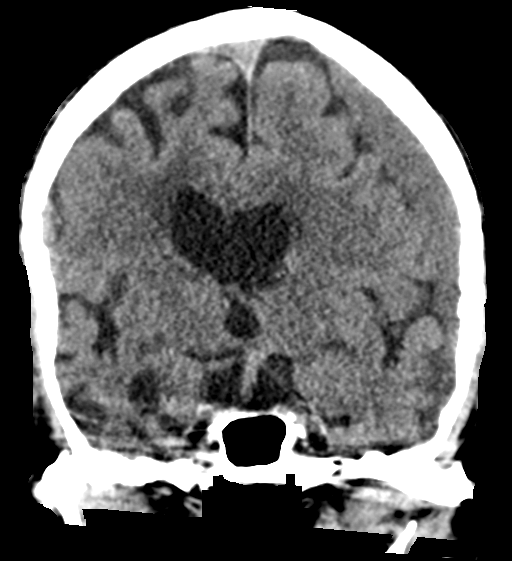

[Series 7: sagittal soft tissue · sagittal · 0.34mm/px · 3 of 51 slices shown]
[im 17/51  brain]
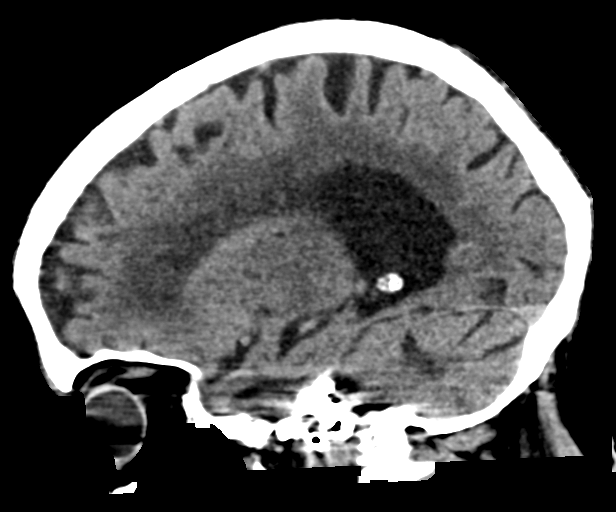
[im 26/51  brain]
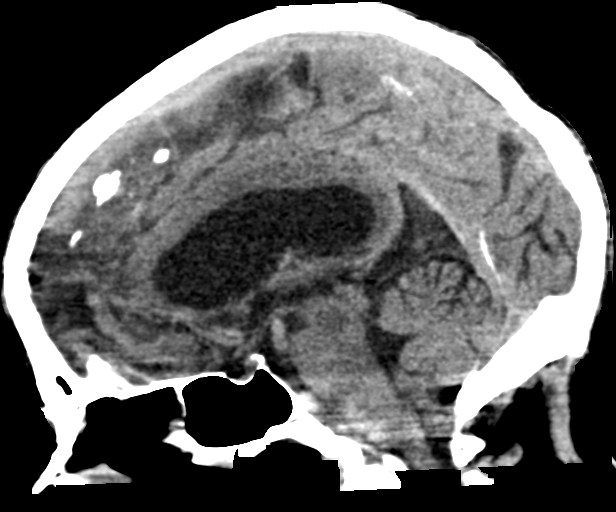
[im 34/51  brain]
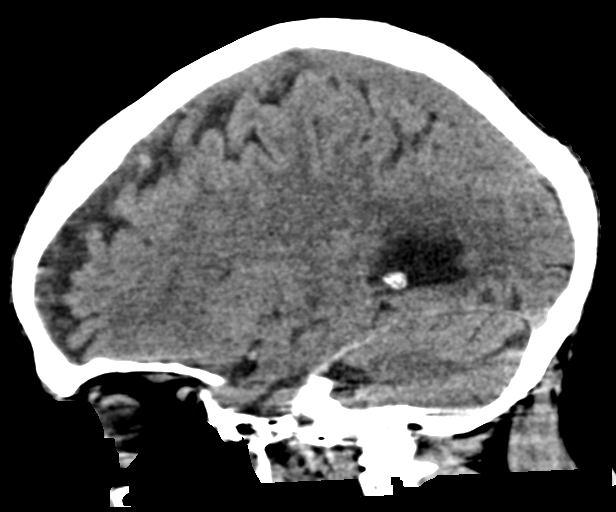

[15 of 47 positions shown; findings below may reference images not displayed]

FINDINGS: Brain: Unchanged appearance of acute on chronic left convexity
subdural hematoma. Rightward midline shift of 5 mm is unchanged. No
new site of hemorrhage. There is mild generalized atrophy with
findings of chronic microvascular ischemia.

Vascular: Atherosclerotic calcification of the vertebral and
internal carotid arteries at the skull base. No abnormal
hyperdensity of the major intracranial arteries or dural venous
sinuses.

Skull: The visualized skull base, calvarium and extracranial soft
tissues are normal.

Sinuses/Orbits: No fluid levels or advanced mucosal thickening of
the visualized paranasal sinuses. No mastoid or middle ear effusion.
The orbits are normal.
IMPRESSION: Unchanged appearance of acute on chronic left convexity subdural
hematoma with 5 mm of rightward midline shift.
